# Patient Record
Sex: Female | Born: 1995 | Race: Black or African American | Hispanic: No | Marital: Single | ZIP: N2G | Smoking: Never smoker
Health system: Southern US, Community
[De-identification: ages and names within clinical notes are randomized; demographics above are authoritative.]

## PROBLEM LIST (undated history)

## (undated) DIAGNOSIS — M609 Myositis, unspecified: Secondary | ICD-10-CM

## (undated) DIAGNOSIS — M359 Systemic involvement of connective tissue, unspecified: Secondary | ICD-10-CM

---
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
## 2016-07-02 ENCOUNTER — Ambulatory Visit (INDEPENDENT_AMBULATORY_CARE_PROVIDER_SITE_OTHER): Payer: Managed Care, Other (non HMO) | Admitting: Family Medicine

## 2016-07-02 DIAGNOSIS — Z8739 Personal history of other diseases of the musculoskeletal system and connective tissue: Secondary | ICD-10-CM

## 2016-07-02 NOTE — Progress Notes (Signed)
Kristen Case today with generalized joint pain. Patient states that she has noticed for the last few months joint pain in both of her hands and right shoulder and most recently in her toes. She occasionally feels some discomfort in her knees. She denies any trauma or injury. She states that at times her fingers do go cold and become white in color. She denies any recent tick bites. She denies any weight loss or weight gain. She denies any fatigue, chest pain, shortness of breath, urinary symptoms, headache. She started taking a new birth control during the summer. She denies any other new medications. Her periods are regular. She denies any family history of any autoimmune disease or thyroid disease. Stiffness of the joints appreciated mostly in the morning.  ROS: Negative except mentioned above. Vitals as per Epic.  GENERAL: NAD HEENT: no pharyngeal erythema, no exudate, no erythema of TMs, no cervical LAD RESP: CTA B CARD: RRR MSK: No obvious joint swelling appreciated, full range of motion of all extremities, no discoloration noted of the hands or feet NEURO: CN II-XII groslly intact   A/P: Generalized joint pain -discussed with patient that I would recommend doing some lab work looking for thyroid disease or autoimmune disease or tick borne illness. Some of her symptoms do sound like Raynauds. Will likely have her follow up with Rheumatology once results have been reviewed. Seek medical attention if any worsening symptoms.

## 2016-07-04 LAB — COMPREHENSIVE METABOLIC PANEL
A/G RATIO: 0.9 — AB (ref 1.2–2.2)
ALBUMIN: 3.7 g/dL (ref 3.5–5.5)
ALT: 74 IU/L — ABNORMAL HIGH (ref 0–32)
AST: 70 IU/L — ABNORMAL HIGH (ref 0–40)
Alkaline Phosphatase: 37 IU/L — ABNORMAL LOW (ref 39–117)
BILIRUBIN TOTAL: 0.4 mg/dL (ref 0.0–1.2)
BUN / CREAT RATIO: 7 — AB (ref 9–23)
BUN: 6 mg/dL (ref 6–20)
CHLORIDE: 100 mmol/L (ref 96–106)
CO2: 24 mmol/L (ref 18–29)
Calcium: 9.1 mg/dL (ref 8.7–10.2)
Creatinine, Ser: 0.9 mg/dL (ref 0.57–1.00)
GFR calc non Af Amer: 93 mL/min/{1.73_m2} (ref >59)
GFR, EST AFRICAN AMERICAN: 107 mL/min/{1.73_m2} (ref >59)
GLOBULIN, TOTAL: 4 g/dL (ref 1.5–4.5)
Glucose: 97 mg/dL (ref 65–99)
POTASSIUM: 4.3 mmol/L (ref 3.5–5.2)
SODIUM: 138 mmol/L (ref 134–144)
TOTAL PROTEIN: 7.7 g/dL (ref 6.0–8.5)

## 2016-07-04 LAB — CBC WITH DIFFERENTIAL/PLATELET
BASOS ABS: 0 10*3/uL (ref 0.0–0.2)
Basos: 0 %
EOS (ABSOLUTE): 0 10*3/uL (ref 0.0–0.4)
Eos: 1 %
HEMOGLOBIN: 13.4 g/dL (ref 11.1–15.9)
Hematocrit: 37.9 % (ref 34.0–46.6)
Immature Grans (Abs): 0 10*3/uL (ref 0.0–0.1)
Immature Granulocytes: 0 %
LYMPHS ABS: 1.1 10*3/uL (ref 0.7–3.1)
Lymphs: 20 %
MCH: 31 pg (ref 26.6–33.0)
MCHC: 35.4 g/dL (ref 31.5–35.7)
MCV: 88 fL (ref 79–97)
MONOCYTES: 9 %
Monocytes Absolute: 0.5 10*3/uL (ref 0.1–0.9)
NEUTROS ABS: 3.7 10*3/uL (ref 1.4–7.0)
Neutrophils: 70 %
Platelets: 301 10*3/uL (ref 150–379)
RBC: 4.32 x10E6/uL (ref 3.77–5.28)
RDW: 12.9 % (ref 12.3–15.4)
WBC: 5.3 10*3/uL (ref 3.4–10.8)

## 2016-07-04 LAB — TSH: TSH: 0.98 u[IU]/mL (ref 0.450–4.500)

## 2016-07-04 LAB — B. BURGDORFI ANTIBODIES: Lyme IgG/IgM Ab: <0.91 {ISR} (ref 0.00–0.90)

## 2016-07-04 LAB — ROCKY MTN SPOTTED FVR ABS PNL(IGG+IGM)
RMSF IgG: NEGATIVE
RMSF IgM: 0.68 index (ref 0.00–0.89)

## 2016-07-04 LAB — SEDIMENTATION RATE: SED RATE: 36 mm/h — AB (ref 0–32)

## 2016-07-04 LAB — ANA: Anti Nuclear Antibody(ANA): POSITIVE — AB

## 2016-07-04 LAB — RHEUMATOID FACTOR: Rhuematoid fact SerPl-aCnc: 12.3 IU/mL (ref 0.0–13.9)

## 2016-07-09 ENCOUNTER — Encounter: Payer: Self-pay | Admitting: Family Medicine

## 2016-07-09 ENCOUNTER — Ambulatory Visit (INDEPENDENT_AMBULATORY_CARE_PROVIDER_SITE_OTHER): Payer: Managed Care, Other (non HMO) | Admitting: Family Medicine

## 2016-07-09 VITALS — BP 120/75 | HR 79 | Temp 97.8°F

## 2016-07-09 DIAGNOSIS — M2550 Pain in unspecified joint: Secondary | ICD-10-CM

## 2016-07-09 DIAGNOSIS — R748 Abnormal levels of other serum enzymes: Secondary | ICD-10-CM

## 2016-07-09 DIAGNOSIS — R768 Other specified abnormal immunological findings in serum: Secondary | ICD-10-CM

## 2016-07-09 NOTE — Addendum Note (Signed)
Addended by: Dione HousekeeperPATEL, Blu Mcglaun N on: 07/09/2016 02:06 PM   Modules accepted: Orders

## 2016-07-09 NOTE — Progress Notes (Signed)
Patient presents today for follow-up regarding labs that were recently drawn. Patient continues to have some joint pain and states today her discomfort is in her shoulders and her wrists. She denies any pain in her hands today or her lower extremities. She denies any obvious swelling of the area. She denies any fever or chills. She does state that last week after she saw me she did experience some URI symptoms such as sore throat and cough. She denies any abdominal pain or any extreme fatigue. She denies any significant weight loss. She admits to taking Advil muscle and joint pain medication daily and has been doing so for the last month. She denies taking any Tylenol or any other supplements. She is unsure of the dosage of the Advil that she is taking. She states her last menstrual period was last week. She denies any chest pain, shortness of breath, headache. Denies any family history of any autoimmune disease.  ROS: Negative except mentioned above. Vitals as per Epic GENERAL: NAD HEENT: no pharyngeal erythema, no exudate, no significant cervical lymphadenopathy appreciated RESP: CTA B CARD: RRR ABD: Positive bowel sounds, nontender, no organomegaly appreciated NEURO: CN II-XII grossly intact   A/P: Generalized joint pain, elevated ESR, elevated LFTs, positive ANA - recommend follow-up with Rheumatologist for further evaluation and treatment, will repeat CMP and will also do a Monospot test. I've advised the patient not to take excessive amounts of Advil and/or Tylenol. If any acute worsening symptoms she will seek medical attention as discussed.

## 2016-07-10 ENCOUNTER — Other Ambulatory Visit: Payer: Self-pay | Admitting: Family Medicine

## 2016-07-10 DIAGNOSIS — M2550 Pain in unspecified joint: Secondary | ICD-10-CM

## 2016-07-10 LAB — COMPREHENSIVE METABOLIC PANEL
ALT: 73 IU/L — ABNORMAL HIGH (ref 0–32)
AST: 71 IU/L — AB (ref 0–40)
Albumin/Globulin Ratio: 0.9 — ABNORMAL LOW (ref 1.2–2.2)
Albumin: 3.9 g/dL (ref 3.5–5.5)
Alkaline Phosphatase: 46 IU/L (ref 39–117)
BUN/Creatinine Ratio: 9 (ref 9–23)
BUN: 7 mg/dL (ref 6–20)
Bilirubin Total: 0.3 mg/dL (ref 0.0–1.2)
CALCIUM: 9.2 mg/dL (ref 8.7–10.2)
CO2: 24 mmol/L (ref 18–29)
CREATININE: 0.78 mg/dL (ref 0.57–1.00)
Chloride: 100 mmol/L (ref 96–106)
GFR calc Af Amer: 127 mL/min/{1.73_m2} (ref >59)
GFR, EST NON AFRICAN AMERICAN: 111 mL/min/{1.73_m2} (ref >59)
GLOBULIN, TOTAL: 4.2 g/dL (ref 1.5–4.5)
Glucose: 87 mg/dL (ref 65–99)
Potassium: 4.1 mmol/L (ref 3.5–5.2)
SODIUM: 141 mmol/L (ref 134–144)
Total Protein: 8.1 g/dL (ref 6.0–8.5)

## 2016-07-10 LAB — MONONUCLEOSIS SCREEN: Mono Screen: NEGATIVE

## 2016-07-13 LAB — ANA W/REFLEX IF POSITIVE
Anti JO-1: <0.2 AI (ref 0.0–0.9)
Anti Nuclear Antibody(ANA): POSITIVE — AB
Centromere Ab Screen: <0.2 AI (ref 0.0–0.9)
Chromatin Ab SerPl-aCnc: 7.1 AI — ABNORMAL HIGH (ref 0.0–0.9)
DSDNA AB: 1 [IU]/mL (ref 0–9)
ENA SSA (RO) Ab: <0.2 AI (ref 0.0–0.9)
ENA SSB (LA) Ab: <0.2 AI (ref 0.0–0.9)

## 2016-11-21 ENCOUNTER — Other Ambulatory Visit: Payer: Self-pay | Admitting: Student

## 2016-11-21 DIAGNOSIS — R945 Abnormal results of liver function studies: Principal | ICD-10-CM

## 2016-11-21 DIAGNOSIS — R7989 Other specified abnormal findings of blood chemistry: Secondary | ICD-10-CM

## 2016-11-26 ENCOUNTER — Other Ambulatory Visit: Payer: Self-pay | Admitting: Internal Medicine

## 2016-11-26 DIAGNOSIS — M609 Myositis, unspecified: Secondary | ICD-10-CM

## 2016-11-26 DIAGNOSIS — M351 Other overlap syndromes: Secondary | ICD-10-CM

## 2016-12-05 ENCOUNTER — Ambulatory Visit: Payer: PRIVATE HEALTH INSURANCE

## 2016-12-11 ENCOUNTER — Ambulatory Visit
Admission: RE | Admit: 2016-12-11 | Discharge: 2016-12-11 | Disposition: A | Discharge status: Home / Self Care | Payer: Managed Care, Other (non HMO) | Source: Ambulatory Visit | Attending: Student | Admitting: Student

## 2016-12-11 DIAGNOSIS — R945 Abnormal results of liver function studies: Secondary | ICD-10-CM

## 2016-12-11 DIAGNOSIS — R7989 Other specified abnormal findings of blood chemistry: Secondary | ICD-10-CM | POA: Insufficient documentation

## 2017-07-09 ENCOUNTER — Ambulatory Visit: Payer: Managed Care, Other (non HMO) | Admitting: Certified Registered Nurse Anesthetist

## 2017-07-09 ENCOUNTER — Encounter
Admission: RE | Disposition: A | Discharge status: Home / Self Care | Payer: Self-pay | Source: Ambulatory Visit | Attending: Surgery

## 2017-07-09 ENCOUNTER — Ambulatory Visit
Admission: RE | Admit: 2017-07-09 | Discharge: 2017-07-09 | Disposition: A | Discharge status: Home / Self Care | Payer: Managed Care, Other (non HMO) | Source: Ambulatory Visit | Attending: Surgery | Admitting: Surgery

## 2017-07-09 ENCOUNTER — Encounter: Payer: Self-pay | Admitting: *Deleted

## 2017-07-09 ENCOUNTER — Other Ambulatory Visit: Payer: Self-pay

## 2017-07-09 DIAGNOSIS — M351 Other overlap syndromes: Secondary | ICD-10-CM | POA: Diagnosis not present

## 2017-07-09 DIAGNOSIS — M609 Myositis, unspecified: Secondary | ICD-10-CM | POA: Diagnosis present

## 2017-07-09 DIAGNOSIS — G7289 Other specified myopathies: Secondary | ICD-10-CM | POA: Diagnosis not present

## 2017-07-09 HISTORY — DX: Myositis, unspecified: M60.9

## 2017-07-09 HISTORY — PX: MUSCLE BIOPSY: SHX716

## 2017-07-09 HISTORY — DX: Systemic involvement of connective tissue, unspecified: M35.9

## 2017-07-09 LAB — POCT PREGNANCY, URINE: Preg Test, Ur: NEGATIVE

## 2017-07-09 SURGERY — MUSCLE BIOPSY
Anesthesia: General

## 2017-07-09 MED ORDER — FENTANYL CITRATE (PF) 100 MCG/2ML IJ SOLN
INTRAMUSCULAR | Status: AC
  Filled 2017-07-09: qty 2

## 2017-07-09 MED ORDER — BUPIVACAINE-EPINEPHRINE (PF) 0.5% -1:200000 IJ SOLN
INTRAMUSCULAR | Status: DC | PRN
  Administered 2017-07-09: 9 mL

## 2017-07-09 MED ORDER — FENTANYL CITRATE (PF) 100 MCG/2ML IJ SOLN
INTRAMUSCULAR | Status: DC | PRN
Start: 2017-07-09 — End: 2017-07-09
  Administered 2017-07-09 (×2): 25 ug via INTRAVENOUS

## 2017-07-09 MED ORDER — PROPOFOL 500 MG/50ML IV EMUL
INTRAVENOUS | Status: DC | PRN
  Administered 2017-07-09: 125 ug/kg/min via INTRAVENOUS

## 2017-07-09 MED ORDER — PROPOFOL 10 MG/ML IV BOLUS
INTRAVENOUS | Status: AC
  Filled 2017-07-09: qty 20

## 2017-07-09 MED ORDER — BUPIVACAINE HCL (PF) 0.5 % IJ SOLN
INTRAMUSCULAR | Status: AC
  Filled 2017-07-09: qty 30

## 2017-07-09 MED ORDER — FAMOTIDINE 20 MG PO TABS
ORAL_TABLET | ORAL | Status: AC
  Filled 2017-07-09: qty 1

## 2017-07-09 MED ORDER — PROPOFOL 10 MG/ML IV BOLUS
INTRAVENOUS | Status: DC | PRN
  Administered 2017-07-09: 50 mg via INTRAVENOUS

## 2017-07-09 MED ORDER — DEXAMETHASONE SODIUM PHOSPHATE 10 MG/ML IJ SOLN
INTRAMUSCULAR | Status: DC | PRN
  Administered 2017-07-09: 8 mg via INTRAVENOUS

## 2017-07-09 MED ORDER — MIDAZOLAM HCL 2 MG/2ML IJ SOLN
INTRAMUSCULAR | Status: AC
  Filled 2017-07-09: qty 2

## 2017-07-09 MED ORDER — ONDANSETRON HCL 4 MG/2ML IJ SOLN
INTRAMUSCULAR | Status: DC | PRN
  Administered 2017-07-09: 4 mg via INTRAVENOUS

## 2017-07-09 MED ORDER — LACTATED RINGERS IV SOLN
INTRAVENOUS | Status: DC
  Administered 2017-07-09: 12:00:00 via INTRAVENOUS

## 2017-07-09 MED ORDER — BUPIVACAINE-EPINEPHRINE (PF) 0.5% -1:200000 IJ SOLN
INTRAMUSCULAR | Status: AC
Start: 2017-07-09 — End: 2017-07-09
  Filled 2017-07-09: qty 30

## 2017-07-09 MED ORDER — LIDOCAINE HCL (PF) 1 % IJ SOLN
INTRAMUSCULAR | Status: AC
  Filled 2017-07-09: qty 30

## 2017-07-09 MED ORDER — LIDOCAINE HCL 1 % IJ SOLN
INTRAMUSCULAR | Status: DC | PRN
  Administered 2017-07-09: 10 mL

## 2017-07-09 MED ORDER — DEXAMETHASONE SODIUM PHOSPHATE 10 MG/ML IJ SOLN
INTRAMUSCULAR | Status: AC
  Filled 2017-07-09: qty 1

## 2017-07-09 MED ORDER — LIDOCAINE HCL (CARDIAC) 20 MG/ML IV SOLN
INTRAVENOUS | Status: DC | PRN
  Administered 2017-07-09: 60 mg via INTRAVENOUS

## 2017-07-09 MED ORDER — MIDAZOLAM HCL 2 MG/2ML IJ SOLN
INTRAMUSCULAR | Status: DC | PRN
  Administered 2017-07-09: 2 mg via INTRAVENOUS

## 2017-07-09 MED ORDER — LIDOCAINE HCL (PF) 2 % IJ SOLN
INTRAMUSCULAR | Status: AC
  Filled 2017-07-09: qty 10

## 2017-07-09 MED ORDER — PROPOFOL 500 MG/50ML IV EMUL
INTRAVENOUS | Status: AC
  Filled 2017-07-09: qty 50

## 2017-07-09 MED ORDER — FAMOTIDINE 20 MG PO TABS
20.0000 mg | ORAL_TABLET | Freq: Once | ORAL | Status: AC
  Administered 2017-07-09: 20 mg via ORAL

## 2017-07-09 MED ORDER — FENTANYL CITRATE (PF) 100 MCG/2ML IJ SOLN: 25.0000 ug | INTRAMUSCULAR | Status: DC | PRN

## 2017-07-09 MED ORDER — ONDANSETRON HCL 4 MG/2ML IJ SOLN
INTRAMUSCULAR | Status: AC
  Filled 2017-07-09: qty 2

## 2017-07-09 MED ORDER — HYDROCODONE-ACETAMINOPHEN 5-325 MG PO TABS: 1.0000 | ORAL_TABLET | ORAL | 0 refills | Status: AC | PRN

## 2017-07-09 MED ORDER — PROMETHAZINE HCL 25 MG/ML IJ SOLN: 6.2500 mg | INTRAMUSCULAR | Status: DC | PRN

## 2017-07-09 SURGICAL SUPPLY — 19 items
BLADE SURG 15 STRL LF DISP TIS (BLADE) ×1 IMPLANT
BLADE SURG 15 STRL SS (BLADE) ×1
ELECT CAUTERY NEEDLE TIP 1.0 (MISCELLANEOUS) ×2
ELECT REM PT RETURN 9FT ADLT (ELECTROSURGICAL) ×2
ELECTRODE CAUTERY NEDL TIP 1.0 (MISCELLANEOUS) ×1 IMPLANT
ELECTRODE REM PT RTRN 9FT ADLT (ELECTROSURGICAL) ×1 IMPLANT
GAUZE SPONGE 4X4 12PLY STRL (GAUZE/BANDAGES/DRESSINGS) ×2 IMPLANT
GLOVE BIO SURGEON STRL SZ7.5 (GLOVE) ×2 IMPLANT
GOWN STRL REUS W/ TWL LRG LVL3 (GOWN DISPOSABLE) ×2 IMPLANT
GOWN STRL REUS W/TWL LRG LVL3 (GOWN DISPOSABLE) ×2
IV NS 500ML (IV SOLUTION) ×1
IV NS 500ML BAXH (IV SOLUTION) ×1 IMPLANT
KIT RM TURNOVER STRD PROC AR (KITS) ×2 IMPLANT
LABEL OR SOLS (LABEL) ×2 IMPLANT
NEEDLE HYPO 25X1 1.5 SAFETY (NEEDLE) ×2 IMPLANT
PACK BASIN MINOR ARMC (MISCELLANEOUS) ×2 IMPLANT
SUT VIC AB 3-0 SH 27 (SUTURE) ×2
SUT VIC AB 3-0 SH 27X BRD (SUTURE) ×2 IMPLANT
SYRINGE 10CC LL (SYRINGE) ×2 IMPLANT

## 2017-07-09 NOTE — Op Note (Signed)
OPERATIVE REPORT  PREOPERATIVE  DIAGNOSIS: . Myositis  POSTOPERATIVE DIAGNOSIS: . Myositis  PROCEDURE: .  ANESTHESIA:  General  SURGEON: Renda RollsWilton Norena Bratton  MD   INDICATIONS: . She has a history of mixed connective tissue disease. Recent MRI was suspicious of myositis. Muscle biopsy was recommended for further evaluation.  With the patient on the operating table in the supine position she was sedated by the anesthesia staff and monitored. A pillow was placed behind the left knee to flex the hip. Ultrasound was used to identify location of saphenous vein. The upper medial thigh was prepared with ChloraPrep and draped in a sterile manner. A site medial to the saphenous vein was selected overlying the adductor longus muscle. The skin was infiltrated with 1% Xylocaine. A longitudinally oriented incision was made and carried down through subcutaneous tissues. Electrocautery was used for hemostasis. One bleeding point was suture ligated with 4-0 chromic. The deep fascia was incised exposing the adductor longus muscle. A sterile wooden cotton swab was used and placed over the muscle and sutured to the muscle with 4-0 nylon proximally and distally. Next a portion of muscle approximately 2.8 cm in length was excised and maintained stretch and placed in saline soaked gauze. Next an additional portion of muscle approximately 1 g in size was excised and placed also in the saline soaked gauze and submitted immediately to the lab for pathology. The wound was inspected and several tiny bleeding points are cauterized. The surrounding muscle and subcuticular tissues were infiltrated with 1/2% Sensorcaine with epinephrine. The deep fascia was closed with interrupted 3-0 Vicryl figure-of-eight sutures. The skin was closed with running 4-0 Monocryl subcutaneous for suture and Dermabond.  The patient tolerated surgery satisfactorily and was prepared for transfer to the recovery  Renda RollsWilton Dena Esperanza M.D.

## 2017-07-09 NOTE — Anesthesia Preprocedure Evaluation (Signed)
Anesthesia Evaluation  Patient identified by MRN, date of birth, ID band Patient awake    Reviewed: Allergy & Precautions, H&P , NPO status , Patient's Chart, lab work & pertinent test results, reviewed documented beta blocker date and time   History of Anesthesia Complications Negative for: history of anesthetic complications  Airway Mallampati: I  TM Distance: >3 FB Neck ROM: full    Dental no notable dental hx. (+) Teeth Intact, Dental Advidsory Given   Pulmonary neg pulmonary ROS,           Cardiovascular Exercise Tolerance: Good negative cardio ROS       Neuro/Psych neg Seizures  Neuromuscular disease negative psych ROS   GI/Hepatic negative GI ROS, Neg liver ROS,   Endo/Other  negative endocrine ROS  Renal/GU negative Renal ROS  negative genitourinary   Musculoskeletal   Abdominal   Peds  Hematology negative hematology ROS (+)   Anesthesia Other Findings Past Medical History: No date: Connective tissue disease (HCC) No date: Muscle inflammation   Reproductive/Obstetrics negative OB ROS                             Anesthesia Physical Anesthesia Plan  ASA: II  Anesthesia Plan: General   Post-op Pain Management:    Induction: Intravenous  PONV Risk Score and Plan: 3 and Ondansetron, Dexamethasone and Propofol infusion  Airway Management Planned: Simple Face Mask  Additional Equipment:   Intra-op Plan:   Post-operative Plan:   Informed Consent: I have reviewed the patients History and Physical, chart, labs and discussed the procedure including the risks, benefits and alternatives for the proposed anesthesia with the patient or authorized representative who has indicated his/her understanding and acceptance.   Dental Advisory Given  Plan Discussed with: Anesthesiologist, CRNA and Surgeon  Anesthesia Plan Comments:         Anesthesia Quick Evaluation

## 2017-07-09 NOTE — Anesthesia Post-op Follow-up Note (Signed)
Anesthesia QCDR form completed.        

## 2017-07-09 NOTE — H&P (Signed)
  She comes in prepared for muscle biopsy. I reviewed with her history of mixed connective tissue disease and recent MRI  finding suspicious of myositis.  She reports no change in condition since the office visit.  The left side was marked YES  I discussed the plan for surgery.

## 2017-07-09 NOTE — Discharge Instructions (Signed)
Take Tylenol or Norco if needed for pain.  Should not drive or do anything dangerous when taking Norco.  May shower and blot dry.  May return to class when ready.  AMBULATORY SURGERY  DISCHARGE INSTRUCTIONS   1) The drugs that you were given will stay in your system until tomorrow so for the next 24 hours you should not:  A) Drive an automobile B) Make any legal decisions C) Drink any alcoholic beverage   2) You may resume regular meals tomorrow.  Today it is better to start with liquids and gradually work up to solid foods.  You may eat anything you prefer, but it is better to start with liquids, then soup and crackers, and gradually work up to solid foods.   3) Please notify your doctor immediately if you have any unusual bleeding, trouble breathing, redness and pain at the surgery site, drainage, fever, or pain not relieved by medication.    4) Additional Instructions:     Please contact your physician with any problems or Same Day Surgery at [phone], Monday through Friday 6 am to 4 pm, or [place] at [facility] Main number at [phone].

## 2017-07-09 NOTE — Transfer of Care (Signed)
Immediate Anesthesia Transfer of Care Note  Patient: Kristen Case  Procedure(s) Performed: MUSCLE BIOPSY (N/A )  Patient Location: PACU  Anesthesia Type:MAC  Level of Consciousness: awake, drowsy and patient cooperative  Airway & Oxygen Therapy: Patient Spontanous Breathing  Post-op Assessment: Report given to RN and Post -op Vital signs reviewed and stable  Post vital signs: Reviewed and stable  Last Vitals:  Vitals:   07/09/17 1043  BP: 116/71  Pulse: 78  Resp: 16  Temp: 36.7 C  SpO2: 100%    Last Pain:  Vitals:   07/09/17 1043  TempSrc: Oral         Complications: No apparent anesthesia complications

## 2017-07-10 ENCOUNTER — Encounter: Payer: Self-pay | Admitting: Surgery

## 2017-07-10 NOTE — Anesthesia Postprocedure Evaluation (Signed)
Anesthesia Post Note  Patient: Kristen Case  Procedure(s) Performed: MUSCLE BIOPSY (N/A )  Patient location during evaluation: PACU Anesthesia Type: General Level of consciousness: awake and alert Pain management: pain level controlled Vital Signs Assessment: post-procedure vital signs reviewed and stable Respiratory status: spontaneous breathing, nonlabored ventilation, respiratory function stable and patient connected to nasal cannula oxygen Cardiovascular status: blood pressure returned to baseline and stable Postop Assessment: no apparent nausea or vomiting Anesthetic complications: no     Last Vitals:  Vitals:   07/09/17 1421 07/09/17 1438  BP: 129/71 (!) 120/96  Pulse:  (!) 45  Resp: 16 16  Temp:  36.8 C  SpO2: 100% 95%    Last Pain:  Vitals:   07/09/17 1438  TempSrc: Temporal                 Lenard SimmerAndrew Jasalyn Frysinger

## 2017-09-10 ENCOUNTER — Encounter: Payer: Self-pay | Admitting: Surgery

## 2017-09-10 LAB — SURGICAL PATHOLOGY

## 2017-10-07 ENCOUNTER — Encounter: Payer: Self-pay | Admitting: Family Medicine

## 2017-10-07 ENCOUNTER — Ambulatory Visit (INDEPENDENT_AMBULATORY_CARE_PROVIDER_SITE_OTHER): Payer: Managed Care, Other (non HMO) | Admitting: Family Medicine

## 2017-10-07 DIAGNOSIS — S76312A Strain of muscle, fascia and tendon of the posterior muscle group at thigh level, left thigh, initial encounter: Secondary | ICD-10-CM

## 2017-10-07 NOTE — Progress Notes (Signed)
Patient presents today with symptoms of left hamstring injury. Patient states that her injury happened last Thursday. She states that she was doing the hurdles warming up for her competition when she experienced the left hamstring pain. She was unable to continue. She was on crutches for about a day. She states that the pain is decreased and she is able to walk without much difficulty. The area is still mildly sore. She has been using compression and ice on the area. She states her last injury to her left hamstring was about 2 years ago. She does have a history of mixed connective tissue disorder. She does see Rheumatology and was recently prescribed Prednisone a few weeks ago. She has had some issues with insomnia she feels related to the Prednisone. She denies any other joint or muscle issues right now.   ROS: Negative except mentioned above. Vitals as per Epic. GENERAL: NAD MSK: Left hamstring - no obvious ecchymosis, swelling or defect, there is no tenderness at the ischial tuberosity or attachment distally, there is some mild medial hamstring tenderness on palpation and with hamstring strength testing, 4 out of 5 strength of hamstring compared to right, normal gait, no calf tenderness or swelling, NV intact NEURO: CN II-XII grossly intact   A/P: Left hamstring strain - discussed ice, compression, rest, rehabilitation with trainer and PT, advance activity as tolerated, her chronic illness may put her at more risk of most of skeletal injury however she has not had a hamstring injury similar to this for 2 years, seek medical attention if symptoms persist/worsen.  Insomnia related to Prednisone - discussed with patient to follow-up with rheumatology regarding this before stopping medication.

## 2018-04-24 ENCOUNTER — Ambulatory Visit: Payer: Managed Care, Other (non HMO) | Admitting: Family Medicine

## 2018-04-25 ENCOUNTER — Ambulatory Visit (INDEPENDENT_AMBULATORY_CARE_PROVIDER_SITE_OTHER): Payer: PRIVATE HEALTH INSURANCE | Admitting: Family Medicine

## 2018-04-25 VITALS — BP 103/61 | HR 79 | Temp 98.3°F | Resp 14

## 2018-04-25 DIAGNOSIS — R630 Anorexia: Secondary | ICD-10-CM

## 2018-04-26 LAB — CBC WITH DIFFERENTIAL/PLATELET
BASOS: 0 %
Basophils Absolute: 0 10*3/uL (ref 0.0–0.2)
EOS (ABSOLUTE): 0 10*3/uL (ref 0.0–0.4)
Eos: 0 %
Hematocrit: 38.7 % (ref 34.0–46.6)
Hemoglobin: 12.6 g/dL (ref 11.1–15.9)
IMMATURE GRANULOCYTES: 0 %
Immature Grans (Abs): 0 10*3/uL (ref 0.0–0.1)
Lymphocytes Absolute: 0.9 10*3/uL (ref 0.7–3.1)
Lymphs: 16 %
MCH: 28.4 pg (ref 26.6–33.0)
MCHC: 32.6 g/dL (ref 31.5–35.7)
MCV: 87 fL (ref 79–97)
MONOS ABS: 0.5 10*3/uL (ref 0.1–0.9)
Monocytes: 9 %
NEUTROS PCT: 75 %
Neutrophils Absolute: 4.1 10*3/uL (ref 1.4–7.0)
Platelets: 266 10*3/uL (ref 150–450)
RBC: 4.43 x10E6/uL (ref 3.77–5.28)
RDW: 14.5 % (ref 12.3–15.4)
WBC: 5.6 10*3/uL (ref 3.4–10.8)

## 2018-04-26 LAB — COMPREHENSIVE METABOLIC PANEL
ALBUMIN: 3.9 g/dL (ref 3.5–5.5)
ALK PHOS: 37 IU/L — AB (ref 39–117)
ALT: 23 IU/L (ref 0–32)
AST: 35 IU/L (ref 0–40)
Albumin/Globulin Ratio: 0.9 — ABNORMAL LOW (ref 1.2–2.2)
BILIRUBIN TOTAL: 0.3 mg/dL (ref 0.0–1.2)
BUN / CREAT RATIO: 11 (ref 9–23)
BUN: 7 mg/dL (ref 6–20)
CHLORIDE: 102 mmol/L (ref 96–106)
CO2: 21 mmol/L (ref 20–29)
Calcium: 9.2 mg/dL (ref 8.7–10.2)
Creatinine, Ser: 0.66 mg/dL (ref 0.57–1.00)
GFR calc Af Amer: 146 mL/min/{1.73_m2} (ref >59)
GFR calc non Af Amer: 127 mL/min/{1.73_m2} (ref >59)
GLUCOSE: 79 mg/dL (ref 65–99)
Globulin, Total: 4.4 g/dL (ref 1.5–4.5)
Potassium: 4.4 mmol/L (ref 3.5–5.2)
SODIUM: 138 mmol/L (ref 134–144)
Total Protein: 8.3 g/dL (ref 6.0–8.5)

## 2018-04-26 LAB — TSH: TSH: 1.32 u[IU]/mL (ref 0.450–4.500)

## 2018-06-05 IMAGING — US US ABDOMEN COMPLETE
1 series · 14 of 25 positions shown · non-contrast
Comparison: None in PACs

CLINICAL DATA: Elevated liver function studies. Known connective
tissue disease.

EXAM:
ABDOMEN ULTRASOUND COMPLETE

[Series 1: us abdomen complete · 0.20mm/px · 14 of 100 slices shown]
[im 1/100]
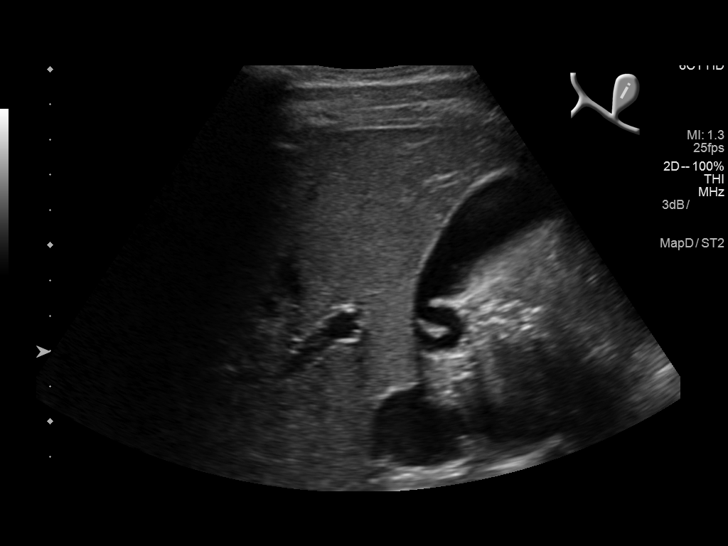
[im 9/100]
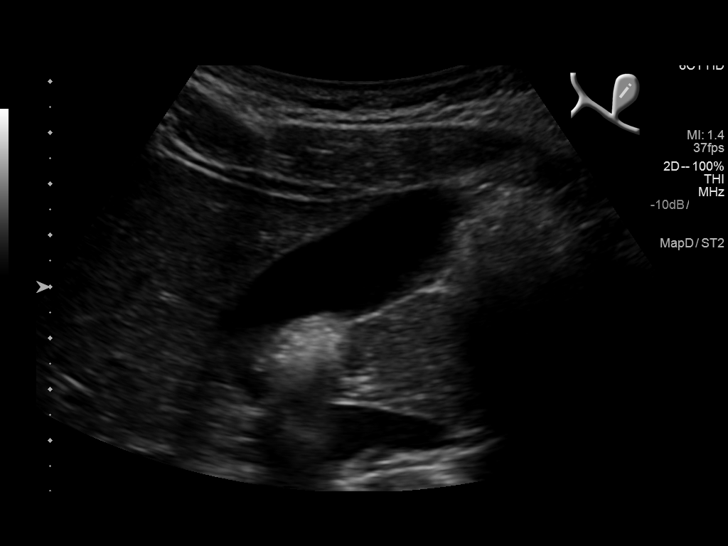
[im 17/100]
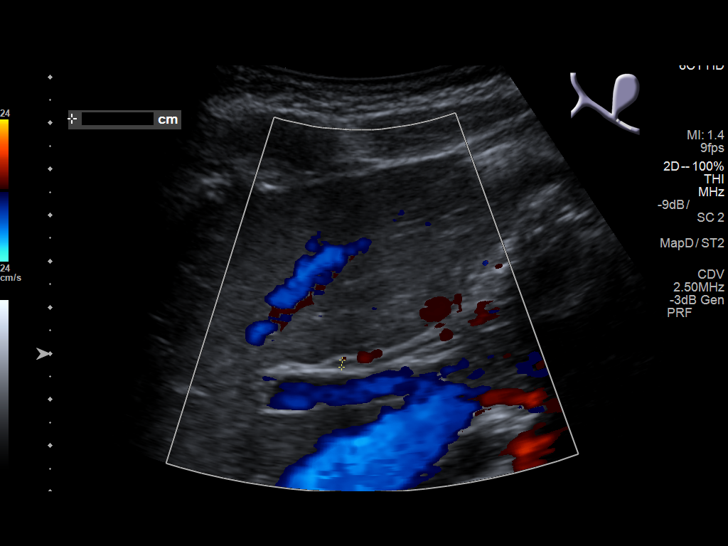
[im 25/100]
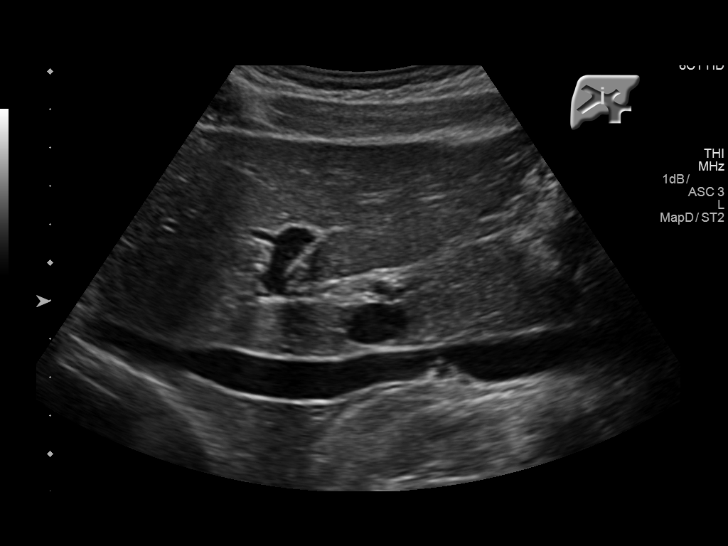
[im 34/100]
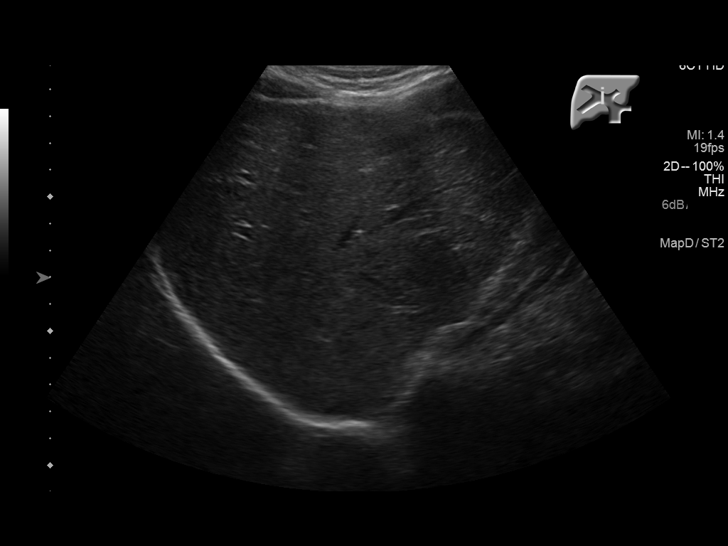
[im 38/100]
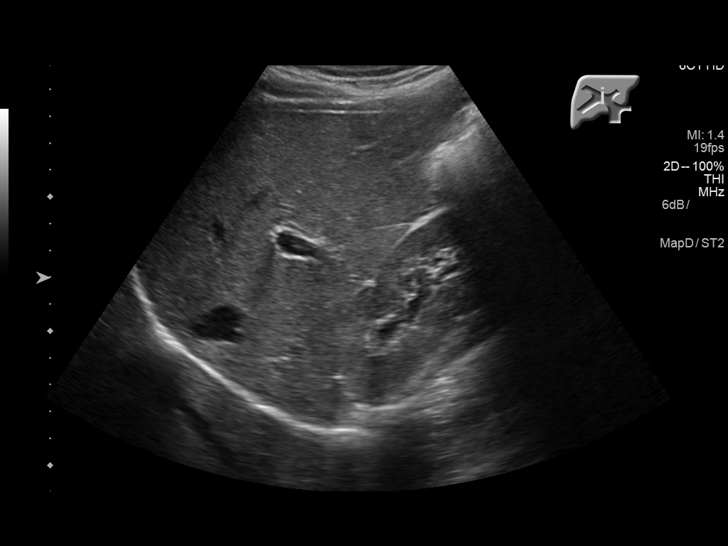
[im 46/100]
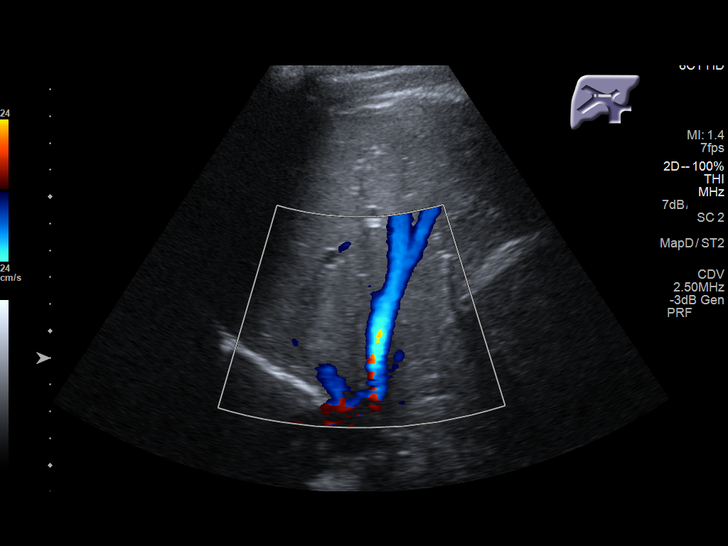
[im 54/100]
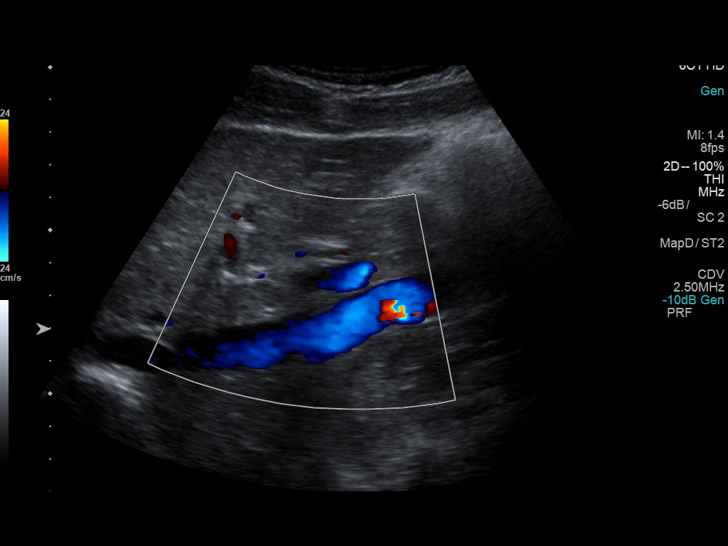
[im 62/100]
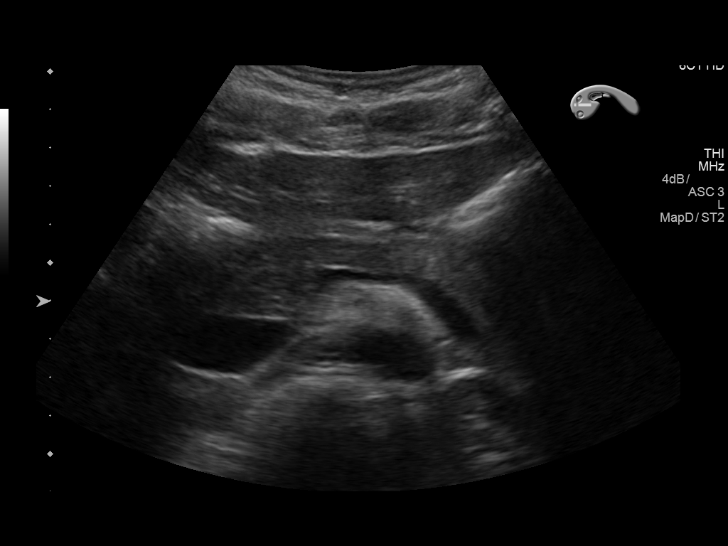
[im 67/100]
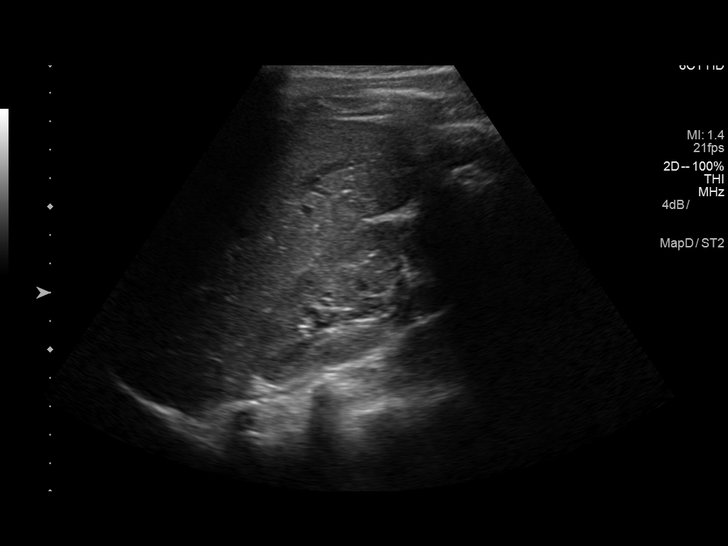
[im 75/100]
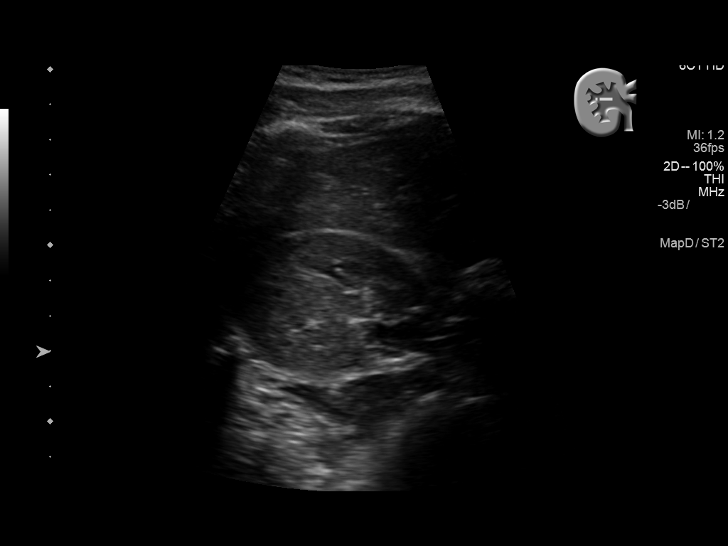
[im 83/100]
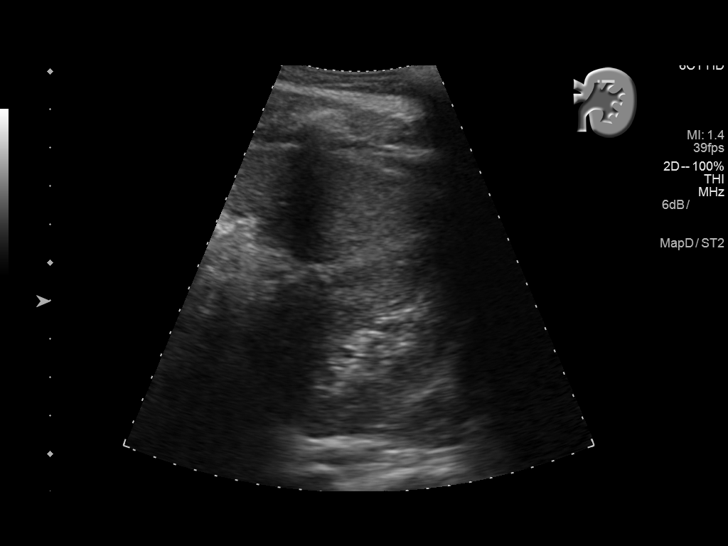
[im 91/100]
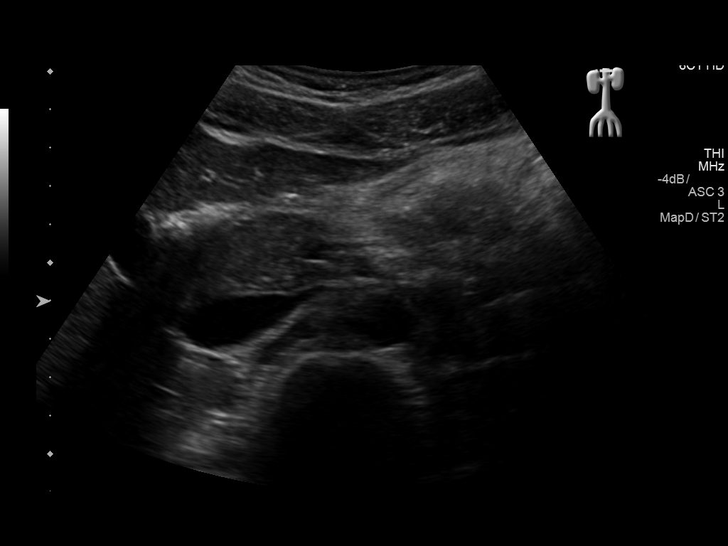
[im 100/100]
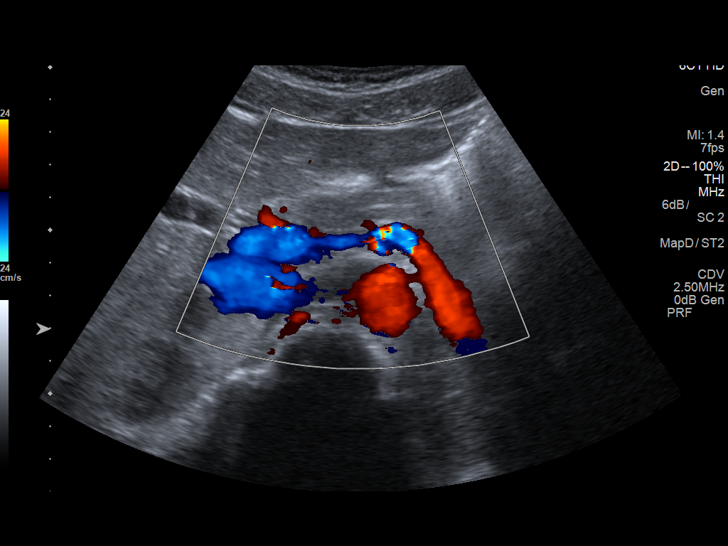

[14 of 25 positions shown; findings below may reference images not displayed]

FINDINGS: Gallbladder: No gallstones or wall thickening visualized. No
sonographic Murphy sign noted by sonographer.

Common bile duct: Diameter: 1.5 mm

Liver: No focal lesion identified. Within normal limits in
parenchymal echogenicity.

IVC: No abnormality visualized.

Pancreas: Visualized portion unremarkable.

Spleen: The spleen is normal in size and echotexture.

Right Kidney: Length: 10.2 cm. Echogenicity within normal limits. No
mass or hydronephrosis visualized.

Left Kidney: Length: 10.4 cm. Echogenicity within normal limits. No
mass or hydronephrosis visualized.

Abdominal aorta: No aneurysm visualized.

Other findings: No ascites.
IMPRESSION: Normal abdominal ultrasound examination.

## 2018-06-06 ENCOUNTER — Ambulatory Visit (INDEPENDENT_AMBULATORY_CARE_PROVIDER_SITE_OTHER): Payer: Managed Care, Other (non HMO) | Admitting: Family Medicine

## 2018-06-06 ENCOUNTER — Encounter: Payer: Self-pay | Admitting: Family Medicine

## 2018-06-06 VITALS — BP 108/64 | HR 81 | Temp 97.5°F | Resp 14

## 2018-06-06 DIAGNOSIS — Z760 Encounter for issue of repeat prescription: Secondary | ICD-10-CM

## 2018-06-06 NOTE — Progress Notes (Signed)
Patient presents today for refill of her Plaquenil.  Patient was hoping that I could refill the medication without her having to go see Rheumatology.  The Rheumatologist (Dr. Renard Matter) was called and she agreed to refill the medication.  She does not need any labs or a follow-up visit at this time.  Patient denies any symptoms at this time.  Patient appreciative.

## 2018-09-16 ENCOUNTER — Ambulatory Visit (INDEPENDENT_AMBULATORY_CARE_PROVIDER_SITE_OTHER): Payer: PRIVATE HEALTH INSURANCE | Admitting: Family Medicine

## 2018-09-16 VITALS — BP 113/67 | HR 74 | Temp 98.0°F | Resp 16

## 2018-09-16 DIAGNOSIS — H1089 Other conjunctivitis: Secondary | ICD-10-CM

## 2018-09-16 DIAGNOSIS — R0981 Nasal congestion: Secondary | ICD-10-CM

## 2018-09-16 MED ORDER — POLYMYXIN B-TRIMETHOPRIM 10000-0.1 UNIT/ML-% OP SOLN: 1.0000 [drp] | Freq: Four times a day (QID) | OPHTHALMIC | 0 refills | Status: AC

## 2018-09-16 MED ORDER — AMOXICILLIN 500 MG PO CAPS: 500.0000 mg | ORAL_CAPSULE | Freq: Two times a day (BID) | ORAL | 0 refills | Status: AC

## 2018-09-16 NOTE — Progress Notes (Signed)
Patient presents today with symptoms of nasal congestion and headache for the last week.  Patient has had colored mucus from her nose.  She denies any significant sinus pain or ear pain.  She denies any fever or chills.  She denies any significant cough or sore throat.  She has noticed some eye redness and discharge from the eyes in the morning L>R.  She denies contact lens use.  She denies any eye pain or vision difficulty.  She has not taken any medications for her symptoms.  ROS: Negative except mentioned above. Vitals as per Epic. GENERAL: NAD HEENT: no pharyngeal erythema, no exudate, no erythema of TMs, no cervical LAD, mild erythema of left conjunctive, minimal erythema right conjunctiva, PERL, EOMI RESP: CTA B CARD: RRR NEURO: CN II-XII grossly intact   A/P: Nasal Congestion -will treat with Amoxicillin, Flonase, Claritin as needed, seek medical attention if symptoms persist or worsen as discussed.  Bilateral Conjunctivitis -will treat with Polytrim, wash hands frequently, dispose of any eye make-up recently worn, wash any linens that may have touched the eyes, no class or athletic activity today, recommend annual eye exam with optometrist/ophthalmologist.  Patient will need to check with athletic trainer if her insurance covers this.

## 2018-10-29 NOTE — Progress Notes (Addendum)
Patient presents today with symptoms of "feeling like she has acid". This causes her to have decreased appetite.  She has had symptoms for about 2 weeks. She admits to skipping meals sometimes because she is so busy. Patient does have a chronic rheumatologic condition and doesn't know if her medications are causing her symptoms. She denies abdominal pain, trouble swallowing, vomiting, melena, BRBPR, CP, SOB, weight loss. She denies alcohol use or any illicit drug use. She denies eating spicy foods or increased caffeine. She admits to drinking plenty of water and not eating late at night.   ROS: Negative except mentioned above. Vitals as per Epic.  GENERAL: NAD HEENT: no pharyngeal erythema, no exudate, no erythema of TMs, no cervical LAD RESP: CTA B CARD: RRR ABD: +BS, NT, no rebound or guarding, no flank tenderness NEURO: CN II-XII grossly intact   A/P: Gastritis/GERD- discussed possibility of this diagnosis or ulcer, basic labs drawn, would recommend follow-up with her rheumatologist to see if this is related to her chronic condition or her medications, may need to see GI if symptoms persist/worsen, can try Prilosec for 1-2 weeks to see if it makes a difference. Encouraged patient not to skip meals and to pack a few snacks in her bag daily. Encouraged drinking plenty of water, avoiding caffeine or spicy/greasy food, not eating late at night. If any acute worsening symptoms seek medical attention as discussed.
